# Patient Record
Sex: Male | Born: 1995 | Race: White | Hispanic: No | Marital: Single | Smoking: Never smoker
Health system: Southern US, Community
[De-identification: ages and names within clinical notes are randomized; demographics above are authoritative.]

---
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
## 2019-02-10 ENCOUNTER — Other Ambulatory Visit: Payer: Self-pay

## 2019-02-10 DIAGNOSIS — Z20822 Contact with and (suspected) exposure to covid-19: Secondary | ICD-10-CM

## 2019-02-11 LAB — NOVEL CORONAVIRUS, NAA: SARS-CoV-2, NAA: NOT DETECTED

## 2019-02-28 ENCOUNTER — Ambulatory Visit: Payer: Self-pay

## 2019-02-28 ENCOUNTER — Other Ambulatory Visit: Payer: Self-pay

## 2019-02-28 ENCOUNTER — Other Ambulatory Visit: Payer: Self-pay | Admitting: Physician Assistant

## 2019-02-28 DIAGNOSIS — Z0000 Encounter for general adult medical examination without abnormal findings: Secondary | ICD-10-CM

## 2020-07-18 ENCOUNTER — Ambulatory Visit (INDEPENDENT_AMBULATORY_CARE_PROVIDER_SITE_OTHER): Payer: BC Managed Care – PPO | Admitting: Podiatry

## 2020-07-18 ENCOUNTER — Ambulatory Visit (INDEPENDENT_AMBULATORY_CARE_PROVIDER_SITE_OTHER): Payer: BC Managed Care – PPO

## 2020-07-18 ENCOUNTER — Encounter: Payer: Self-pay | Admitting: Podiatry

## 2020-07-18 ENCOUNTER — Other Ambulatory Visit: Payer: Self-pay

## 2020-07-18 DIAGNOSIS — M779 Enthesopathy, unspecified: Secondary | ICD-10-CM

## 2020-07-18 DIAGNOSIS — M79672 Pain in left foot: Secondary | ICD-10-CM

## 2020-07-18 DIAGNOSIS — M79671 Pain in right foot: Secondary | ICD-10-CM

## 2020-07-18 MED ORDER — TRIAMCINOLONE ACETONIDE 10 MG/ML IJ SUSP
10.0000 mg | Freq: Once | INTRAMUSCULAR | Status: AC
  Administered 2020-07-18: 10 mg

## 2020-07-18 MED ORDER — DICLOFENAC SODIUM 75 MG PO TBEC: 75.0000 mg | DELAYED_RELEASE_TABLET | Freq: Two times a day (BID) | ORAL | 2 refills | Status: AC

## 2020-07-18 NOTE — Progress Notes (Signed)
Subjective:   Patient ID: Peter Woodward, male   DOB: 25 y.o.   MRN: [id number]   HPI Patient presents stating his had a lot of pain in the outside of his left foot and he is a runner and runs 3-4 times per week approximately 6 to 8 miles.  States is been sore and he has stopped running over the last few weeks and it feels inflamed and he wants to get back to running.  Patient does not smoke likes to be active   Review of Systems  All other systems reviewed and are negative.       Objective:  Physical Exam Vitals and nursing note reviewed.  Constitutional:      Appearance: He is well-developed.  Pulmonary:     Effort: Pulmonary effort is normal.  Musculoskeletal:        General: Normal range of motion.  Skin:    General: Skin is warm.  Neurological:     Mental Status: He is alert.     Neurovascular status intact muscle strength adequate range of motion adequate.  Patient has inflammation pain lateral side left foot around the base of the fifth metatarsal where the peroneal tendon attaches with no indication muscle strength loss and only mild edema around the area.  Patient has good digital perfusion well oriented x3     Assessment:  Ability for inflammatory peroneal tendinitis with probability of a low-grade injury which occurred     Plan:  H&P reviewed condition and I went ahead today did sterile prep and injected around the area 3 mg Kenalog 5 mg Xylocaine and advised on ice therapy anti-inflammatories diclofenac 75 mg twice daily and gradual return to his activities.  Patient will be seen back to recheck again and may require orthotics or casting depending on recovery  X-rays were negative for signs of fracture did indicate mild inflammation around base of fifth metatarsal left

## 2021-05-31 IMAGING — DX DG CHEST 2V
2 series · 2 of 2 positions shown · non-contrast
Comparison: None.

CLINICAL DATA: Physical examination.

EXAM:
CHEST - 2 VIEW

[chest pa]
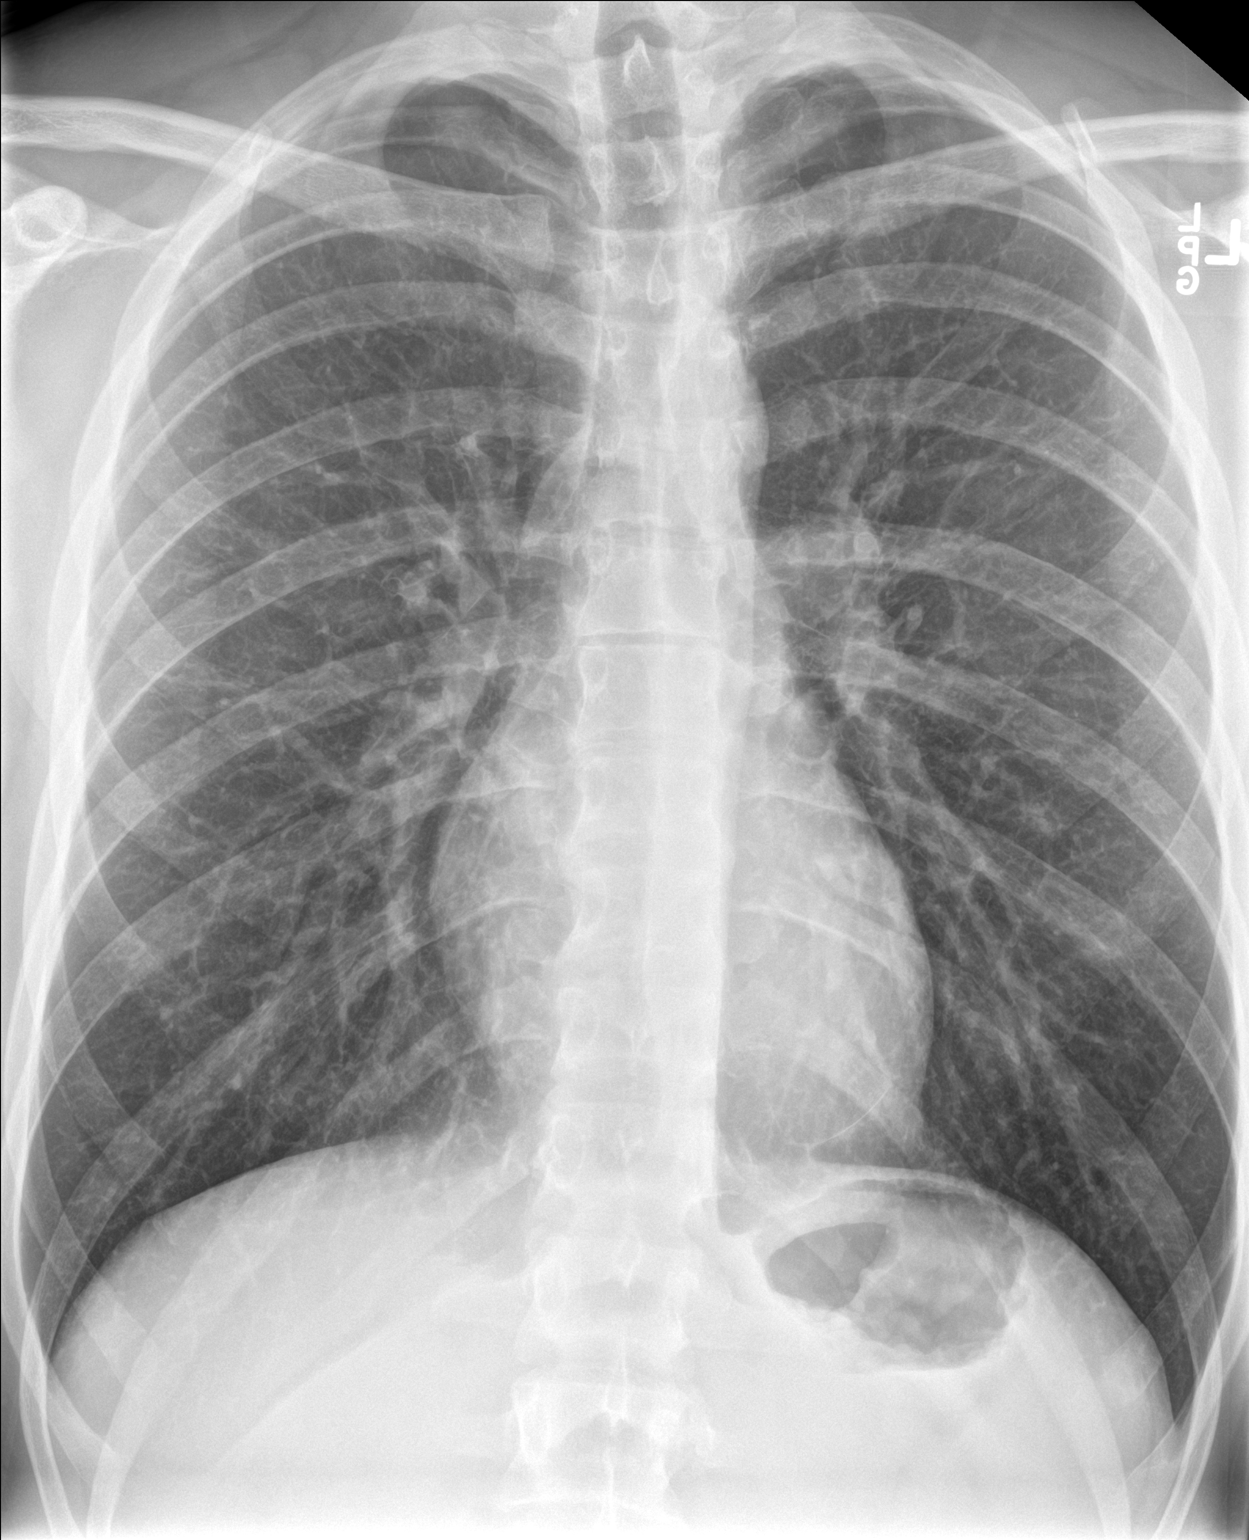

[chest lat]
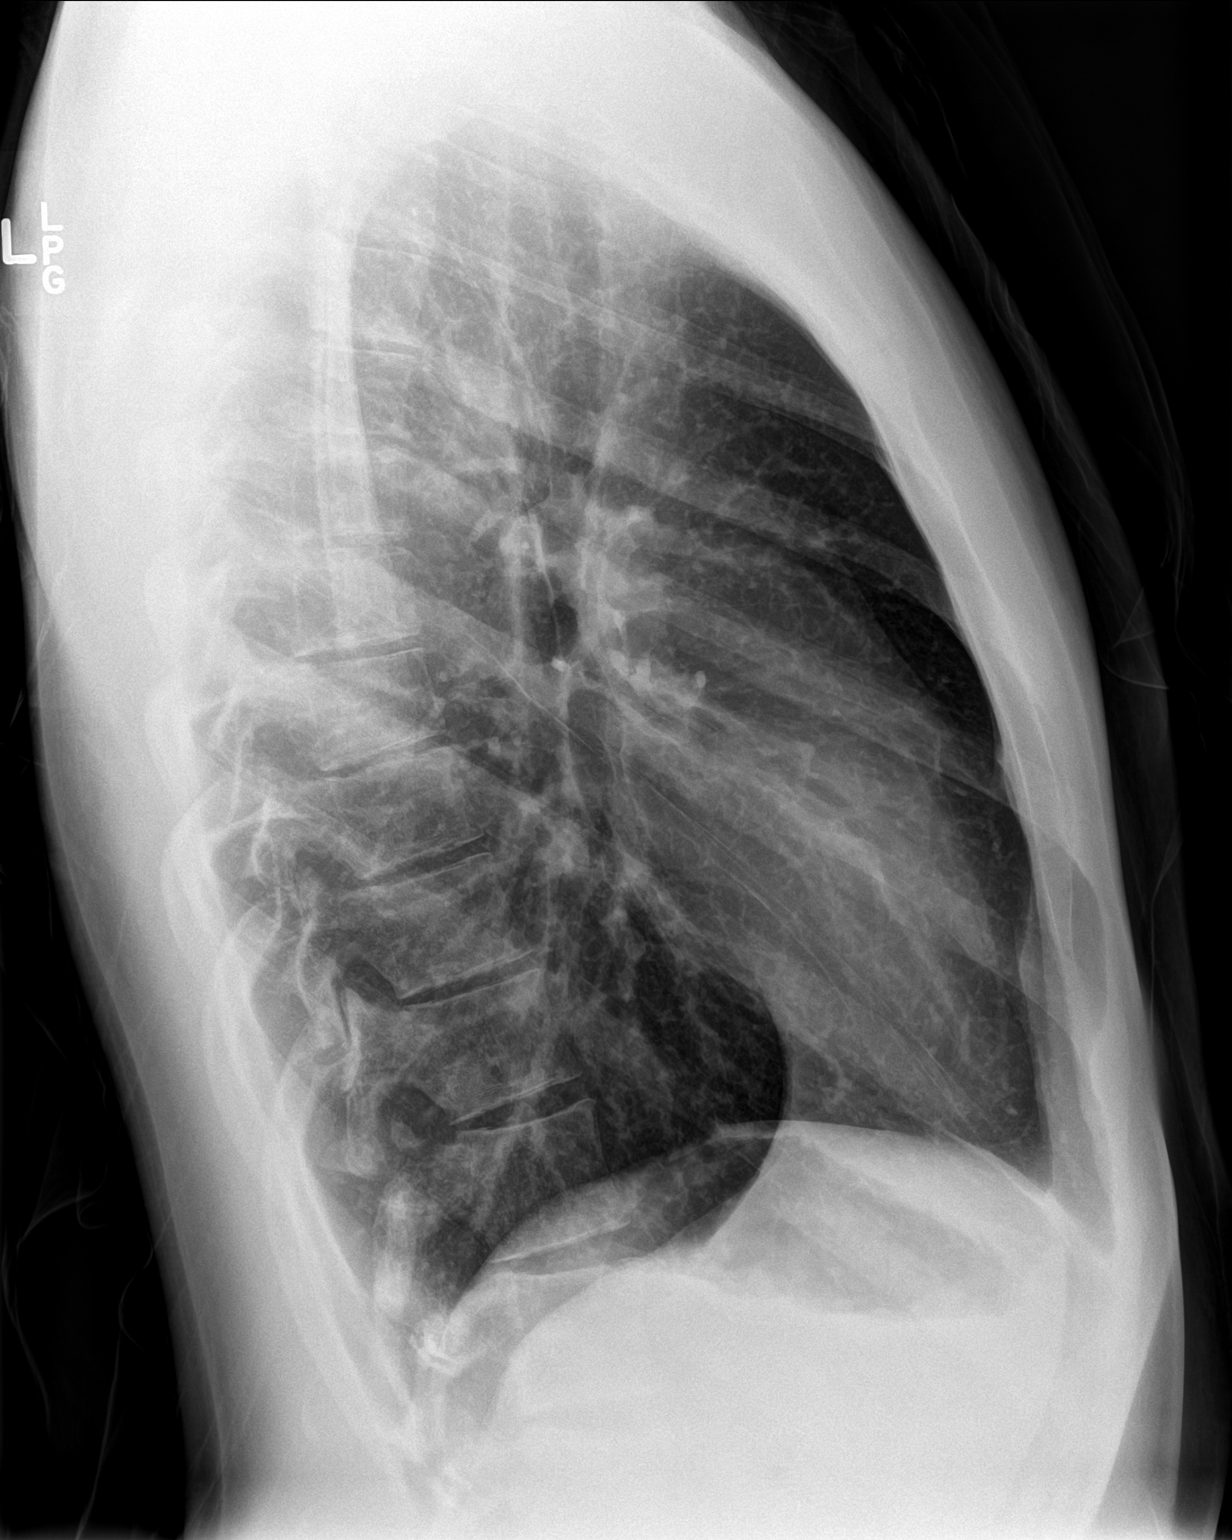

[2 of 2 positions shown; findings below may reference images not displayed]

FINDINGS: The lungs are clear. Heart size is normal. No pneumothorax or
pleural effusion. No acute or focal bony abnormality.
IMPRESSION: Normal chest.
